# Patient Record
Sex: Female | Born: 1980 | Hispanic: No | Smoking: Never smoker
Health system: Southern US, Community
[De-identification: ages and names within clinical notes are randomized; demographics above are authoritative.]

---

## 2003-03-21 ENCOUNTER — Other Ambulatory Visit: Admission: RE | Admit: 2003-03-21 | Discharge: 2003-03-21 | Payer: Self-pay | Admitting: Family Medicine

## 2004-05-02 ENCOUNTER — Other Ambulatory Visit: Admission: RE | Admit: 2004-05-02 | Discharge: 2004-05-02 | Payer: Self-pay | Admitting: Family Medicine

## 2005-06-24 ENCOUNTER — Other Ambulatory Visit: Admission: RE | Admit: 2005-06-24 | Discharge: 2005-06-24 | Payer: Self-pay | Admitting: Family Medicine

## 2006-08-06 ENCOUNTER — Other Ambulatory Visit: Admission: RE | Admit: 2006-08-06 | Discharge: 2006-08-06 | Payer: Self-pay | Admitting: Family Medicine

## 2007-12-23 ENCOUNTER — Other Ambulatory Visit: Admission: RE | Admit: 2007-12-23 | Discharge: 2007-12-23 | Payer: Self-pay | Admitting: Family Medicine

## 2009-02-22 ENCOUNTER — Other Ambulatory Visit: Admission: RE | Admit: 2009-02-22 | Discharge: 2009-02-22 | Payer: Self-pay | Admitting: Family Medicine

## 2010-05-01 ENCOUNTER — Other Ambulatory Visit: Admission: RE | Admit: 2010-05-01 | Discharge: 2010-05-01 | Payer: Self-pay | Admitting: Family Medicine

## 2011-05-08 ENCOUNTER — Other Ambulatory Visit: Payer: Self-pay | Admitting: Family Medicine

## 2011-05-08 DIAGNOSIS — Z113 Encounter for screening for infections with a predominantly sexual mode of transmission: Secondary | ICD-10-CM | POA: Insufficient documentation

## 2011-05-08 DIAGNOSIS — Z124 Encounter for screening for malignant neoplasm of cervix: Secondary | ICD-10-CM | POA: Insufficient documentation

## 2011-05-15 ENCOUNTER — Other Ambulatory Visit (HOSPITAL_COMMUNITY)
Admission: RE | Admit: 2011-05-15 | Discharge: 2011-05-15 | Disposition: A | Discharge status: Home / Self Care | Payer: BC Managed Care – PPO | Source: Ambulatory Visit | Attending: Family Medicine | Admitting: Family Medicine

## 2012-05-17 ENCOUNTER — Other Ambulatory Visit (HOSPITAL_COMMUNITY)
Admission: RE | Admit: 2012-05-17 | Discharge: 2012-05-17 | Disposition: A | Discharge status: Home / Self Care | Payer: BC Managed Care – PPO | Source: Ambulatory Visit | Attending: Family Medicine | Admitting: Family Medicine

## 2012-05-17 ENCOUNTER — Other Ambulatory Visit: Payer: Self-pay | Admitting: Family Medicine

## 2012-05-17 DIAGNOSIS — Z124 Encounter for screening for malignant neoplasm of cervix: Secondary | ICD-10-CM | POA: Insufficient documentation

## 2012-05-17 DIAGNOSIS — Z113 Encounter for screening for infections with a predominantly sexual mode of transmission: Secondary | ICD-10-CM | POA: Insufficient documentation

## 2012-05-17 DIAGNOSIS — Z1151 Encounter for screening for human papillomavirus (HPV): Secondary | ICD-10-CM | POA: Insufficient documentation

## 2013-05-22 ENCOUNTER — Other Ambulatory Visit: Payer: Self-pay | Admitting: Family Medicine

## 2013-05-22 ENCOUNTER — Other Ambulatory Visit (HOSPITAL_COMMUNITY)
Admission: RE | Admit: 2013-05-22 | Discharge: 2013-05-22 | Disposition: A | Discharge status: Home / Self Care | Payer: BC Managed Care – PPO | Source: Ambulatory Visit | Attending: Family Medicine | Admitting: Family Medicine

## 2013-05-22 DIAGNOSIS — Z113 Encounter for screening for infections with a predominantly sexual mode of transmission: Secondary | ICD-10-CM | POA: Insufficient documentation

## 2013-05-22 DIAGNOSIS — Z1151 Encounter for screening for human papillomavirus (HPV): Secondary | ICD-10-CM | POA: Insufficient documentation

## 2013-05-22 DIAGNOSIS — Z0000 Encounter for general adult medical examination without abnormal findings: Secondary | ICD-10-CM | POA: Insufficient documentation

## 2014-05-23 ENCOUNTER — Other Ambulatory Visit: Payer: Self-pay | Admitting: Family Medicine

## 2014-05-23 ENCOUNTER — Other Ambulatory Visit (HOSPITAL_COMMUNITY)
Admission: RE | Admit: 2014-05-23 | Discharge: 2014-05-23 | Disposition: A | Discharge status: Home / Self Care | Payer: BC Managed Care – PPO | Source: Ambulatory Visit | Attending: Family Medicine | Admitting: Family Medicine

## 2014-05-23 DIAGNOSIS — Z113 Encounter for screening for infections with a predominantly sexual mode of transmission: Secondary | ICD-10-CM | POA: Diagnosis present

## 2014-05-23 DIAGNOSIS — R87810 Cervical high risk human papillomavirus (HPV) DNA test positive: Secondary | ICD-10-CM | POA: Diagnosis present

## 2014-05-23 DIAGNOSIS — Z1151 Encounter for screening for human papillomavirus (HPV): Secondary | ICD-10-CM | POA: Diagnosis present

## 2014-05-23 DIAGNOSIS — Z124 Encounter for screening for malignant neoplasm of cervix: Secondary | ICD-10-CM | POA: Diagnosis present

## 2014-05-24 LAB — CYTOLOGY - PAP

## 2014-06-14 ENCOUNTER — Other Ambulatory Visit: Payer: Self-pay | Admitting: Family Medicine

## 2015-05-28 ENCOUNTER — Other Ambulatory Visit (HOSPITAL_COMMUNITY)
Admission: RE | Admit: 2015-05-28 | Discharge: 2015-05-28 | Disposition: A | Discharge status: Home / Self Care | Payer: BLUE CROSS/BLUE SHIELD | Source: Ambulatory Visit | Attending: Family Medicine | Admitting: Family Medicine

## 2015-05-28 ENCOUNTER — Other Ambulatory Visit: Payer: Self-pay | Admitting: Family Medicine

## 2015-05-28 DIAGNOSIS — Z01411 Encounter for gynecological examination (general) (routine) with abnormal findings: Secondary | ICD-10-CM | POA: Insufficient documentation

## 2015-05-28 DIAGNOSIS — Z1151 Encounter for screening for human papillomavirus (HPV): Secondary | ICD-10-CM | POA: Insufficient documentation

## 2015-05-29 LAB — CYTOLOGY - PAP

## 2017-06-15 DIAGNOSIS — Z209 Contact with and (suspected) exposure to unspecified communicable disease: Secondary | ICD-10-CM | POA: Diagnosis not present

## 2017-06-15 DIAGNOSIS — E785 Hyperlipidemia, unspecified: Secondary | ICD-10-CM | POA: Diagnosis not present

## 2017-06-18 ENCOUNTER — Other Ambulatory Visit (HOSPITAL_COMMUNITY)
Admission: RE | Admit: 2017-06-18 | Discharge: 2017-06-18 | Disposition: A | Discharge status: Home / Self Care | Payer: BLUE CROSS/BLUE SHIELD | Source: Ambulatory Visit | Attending: Family Medicine | Admitting: Family Medicine

## 2017-06-18 ENCOUNTER — Other Ambulatory Visit: Payer: Self-pay | Admitting: Family Medicine

## 2017-06-18 DIAGNOSIS — E785 Hyperlipidemia, unspecified: Secondary | ICD-10-CM | POA: Diagnosis not present

## 2017-06-18 DIAGNOSIS — F419 Anxiety disorder, unspecified: Secondary | ICD-10-CM | POA: Diagnosis not present

## 2017-06-18 DIAGNOSIS — Z124 Encounter for screening for malignant neoplasm of cervix: Secondary | ICD-10-CM | POA: Insufficient documentation

## 2017-06-18 DIAGNOSIS — N898 Other specified noninflammatory disorders of vagina: Secondary | ICD-10-CM | POA: Diagnosis not present

## 2017-06-18 DIAGNOSIS — Z23 Encounter for immunization: Secondary | ICD-10-CM | POA: Diagnosis not present

## 2017-06-18 DIAGNOSIS — Z309 Encounter for contraceptive management, unspecified: Secondary | ICD-10-CM | POA: Diagnosis not present

## 2017-06-18 DIAGNOSIS — G479 Sleep disorder, unspecified: Secondary | ICD-10-CM | POA: Diagnosis not present

## 2017-06-18 DIAGNOSIS — Z0000 Encounter for general adult medical examination without abnormal findings: Secondary | ICD-10-CM | POA: Diagnosis not present

## 2017-06-25 LAB — CYTOLOGY - PAP
Chlamydia: NEGATIVE
DIAGNOSIS: NEGATIVE
NEISSERIA GONORRHEA: NEGATIVE

## 2017-11-11 ENCOUNTER — Ambulatory Visit: Payer: BLUE CROSS/BLUE SHIELD | Admitting: Podiatry

## 2017-11-11 ENCOUNTER — Encounter: Payer: Self-pay | Admitting: Podiatry

## 2017-11-11 VITALS — BP 126/76 | HR 84 | Ht 64.0 in | Wt 153.0 lb

## 2017-11-11 DIAGNOSIS — B351 Tinea unguium: Secondary | ICD-10-CM | POA: Diagnosis not present

## 2017-11-11 DIAGNOSIS — M6701 Short Achilles tendon (acquired), right ankle: Secondary | ICD-10-CM | POA: Diagnosis not present

## 2017-11-11 DIAGNOSIS — M722 Plantar fascial fibromatosis: Secondary | ICD-10-CM | POA: Diagnosis not present

## 2017-11-11 DIAGNOSIS — B353 Tinea pedis: Secondary | ICD-10-CM | POA: Diagnosis not present

## 2017-11-11 MED ORDER — TERBINAFINE HCL 250 MG PO TABS: 250.0000 mg | ORAL_TABLET | Freq: Every day | ORAL | 2 refills | Status: DC

## 2017-11-11 NOTE — Patient Instructions (Signed)
Seen for pain in right heel and fungal nails and itch skin. Noted of tight Achilles tendon on right. Stretch exercise reviewed to practice daily. Lamisil ordered.  Pre medication blood work ordered. Night Splint dispensed with instruction. Need to have Custom orthotics. Return in 2 weeks.

## 2017-11-11 NOTE — Progress Notes (Signed)
SUBJECTIVE: 37 y.o. year old female presents Pain in right heel x 2 weeks. Left side started to hurt. Does active exercise.  Positive history of digital surgery 15 years ago.  Review of Systems  Constitutional: Negative.   HENT: Negative.   Eyes: Negative.   Respiratory: Negative.   Cardiovascular: Negative.   Gastrointestinal: Negative.   Genitourinary: Negative.   Musculoskeletal: Positive for myalgias.  Skin: Negative.      OBJECTIVE: DERMATOLOGIC EXAMINATION: Thick yellow fungal nails with dry scaly skin plantar bilateral.  VASCULAR EXAMINATION OF LOWER LIMBS: All pedal pulses are palpable with normal pulsation.  Capillary Filling times within 3 seconds in all digits.  No edema or erythema noted. Temperature gradient from tibial crest to dorsum of foot is within normal bilateral.  NEUROLOGIC EXAMINATION OF THE LOWER LIMBS: All epicritic and tactile sensations grossly intact. Sharp and Dull discriminatory sensations at the plantar ball of hallux is intact bilateral.   MUSCULOSKELETAL EXAMINATION: Positive for tight Achilles tendon on right.  ASSESSMENT: Plantar fasciitis right. Tight Achilles tendon right. Tinea pedis bilateral. Onychomycosis x 10.  PLAN: Reviewed findings and available treatment options. Stretch exercise reviewed for tight Achilles tendon on right. May benefit from custom orthotics. Night Splint dispensed with instruction. Return in two weeks. Pre medication blood work prescribed for Lamisil 250 qd x 3 month.

## 2017-11-12 ENCOUNTER — Telehealth: Payer: Self-pay | Admitting: *Deleted

## 2017-11-12 NOTE — Telephone Encounter (Signed)
Benefits for custom molded orthotics are as follows: Deductible is $700. After the deductible is met patient is responsiible for 20% and insurance will cover 80%. Out of pocket max is 4,000 per benefit period . $35 copay per visit. Go to [email or web address] to view providers policy and guidelines to see if orthotics are medically neccessary.

## 2017-11-15 ENCOUNTER — Encounter: Payer: Self-pay | Admitting: *Deleted

## 2017-11-15 NOTE — Telephone Encounter (Signed)
Letter mailed to patient.

## 2017-11-15 NOTE — Telephone Encounter (Signed)
Called patient to notify of results. Patient states she would like to think about this before she decides. 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

## 2017-11-25 ENCOUNTER — Ambulatory Visit: Payer: BLUE CROSS/BLUE SHIELD | Admitting: Podiatry

## 2017-11-25 ENCOUNTER — Encounter: Payer: Self-pay | Admitting: Podiatry

## 2017-11-25 DIAGNOSIS — M6701 Short Achilles tendon (acquired), right ankle: Secondary | ICD-10-CM

## 2017-11-25 DIAGNOSIS — M722 Plantar fascial fibromatosis: Secondary | ICD-10-CM

## 2017-11-25 NOTE — Progress Notes (Signed)
SUBJECTIVE: 37 y.o. year old female presents to prepare for custom orthotics. Been doing stretch exercise as instructed. Patient is active in exercise. Been taking Lamisil for the last 2 weeks.  Positive history of digital surgery 15 years ago.  OBJECTIVE: DERMATOLOGIC EXAMINATION: Thick yellow fungal nails with dry scaly skin plantar bilateral.  VASCULAR EXAMINATION OF LOWER LIMBS: All pedal pulses are palpable with normal pulsation.  Capillary Filling times within 3 seconds in all digits.  No edema or erythema noted. Temperature gradient from tibial crest to dorsum of foot is within normal bilateral.  NEUROLOGIC EXAMINATION OF THE LOWER LIMBS: All epicritic and tactile sensations grossly intact. Sharp and Dull discriminatory sensations at the plantar ball of hallux is intact bilateral.   MUSCULOSKELETAL EXAMINATION: Positive for tight Achilles tendon on right.  ASSESSMENT: Plantar fasciitis right. Tight Achilles tendon right. Tinea pedis bilateral. Onychomycosis x 10.  PLAN: Both feet casted for Orthotics. Continue with stretch exercise as reviewed for tight Achilles tendon on right. Continue with Night Splint as instructed. Continue with Lamisil 250 qd. Will call when orthotics are ready.

## 2017-11-25 NOTE — Patient Instructions (Signed)
Both feet casted for Orthotics. Continue with stretch exercise as reviewed for tight Achilles tendon on right. Continue with Night Splint as instructed. Continue with Lamisil 250 qd. Will call when orthotics are ready.

## 2018-02-06 ENCOUNTER — Other Ambulatory Visit: Payer: Self-pay | Admitting: Podiatry

## 2018-02-09 DIAGNOSIS — L819 Disorder of pigmentation, unspecified: Secondary | ICD-10-CM | POA: Diagnosis not present

## 2018-07-11 DIAGNOSIS — R030 Elevated blood-pressure reading, without diagnosis of hypertension: Secondary | ICD-10-CM | POA: Diagnosis not present

## 2018-07-11 DIAGNOSIS — Z1329 Encounter for screening for other suspected endocrine disorder: Secondary | ICD-10-CM | POA: Diagnosis not present

## 2018-07-11 DIAGNOSIS — R079 Chest pain, unspecified: Secondary | ICD-10-CM | POA: Diagnosis not present

## 2018-07-11 DIAGNOSIS — R0789 Other chest pain: Secondary | ICD-10-CM | POA: Diagnosis not present

## 2018-07-20 DIAGNOSIS — R7309 Other abnormal glucose: Secondary | ICD-10-CM | POA: Diagnosis not present

## 2018-07-20 DIAGNOSIS — E785 Hyperlipidemia, unspecified: Secondary | ICD-10-CM | POA: Diagnosis not present

## 2018-07-22 DIAGNOSIS — R0789 Other chest pain: Secondary | ICD-10-CM | POA: Diagnosis not present

## 2018-07-22 DIAGNOSIS — Z309 Encounter for contraceptive management, unspecified: Secondary | ICD-10-CM | POA: Diagnosis not present

## 2018-07-22 DIAGNOSIS — F419 Anxiety disorder, unspecified: Secondary | ICD-10-CM | POA: Diagnosis not present

## 2018-07-22 DIAGNOSIS — Z0001 Encounter for general adult medical examination with abnormal findings: Secondary | ICD-10-CM | POA: Diagnosis not present

## 2018-07-22 DIAGNOSIS — Z23 Encounter for immunization: Secondary | ICD-10-CM | POA: Diagnosis not present

## 2018-07-22 DIAGNOSIS — E785 Hyperlipidemia, unspecified: Secondary | ICD-10-CM | POA: Diagnosis not present

## 2018-09-19 ENCOUNTER — Ambulatory Visit
Admission: RE | Admit: 2018-09-19 | Discharge: 2018-09-19 | Disposition: A | Discharge status: Home / Self Care | Payer: BLUE CROSS/BLUE SHIELD | Source: Ambulatory Visit | Attending: Physician Assistant | Admitting: Physician Assistant

## 2018-09-19 ENCOUNTER — Other Ambulatory Visit: Payer: Self-pay | Admitting: Physician Assistant

## 2018-09-19 DIAGNOSIS — K5900 Constipation, unspecified: Secondary | ICD-10-CM | POA: Diagnosis not present

## 2018-09-19 DIAGNOSIS — R1900 Intra-abdominal and pelvic swelling, mass and lump, unspecified site: Secondary | ICD-10-CM | POA: Diagnosis not present

## 2018-09-19 DIAGNOSIS — R140 Abdominal distension (gaseous): Secondary | ICD-10-CM | POA: Diagnosis not present

## 2018-09-23 DIAGNOSIS — E785 Hyperlipidemia, unspecified: Secondary | ICD-10-CM | POA: Diagnosis not present

## 2019-06-07 DIAGNOSIS — Z23 Encounter for immunization: Secondary | ICD-10-CM | POA: Diagnosis not present

## 2019-09-29 IMAGING — DX ABDOMEN - 2 VIEW
2 series · 2 of 2 positions shown · non-contrast
Comparison: None.

CLINICAL DATA: Abdominal bloating with history of constipation

EXAM:
ABDOMEN - 2 VIEW

[dg abd 2 views (1 of 2)]
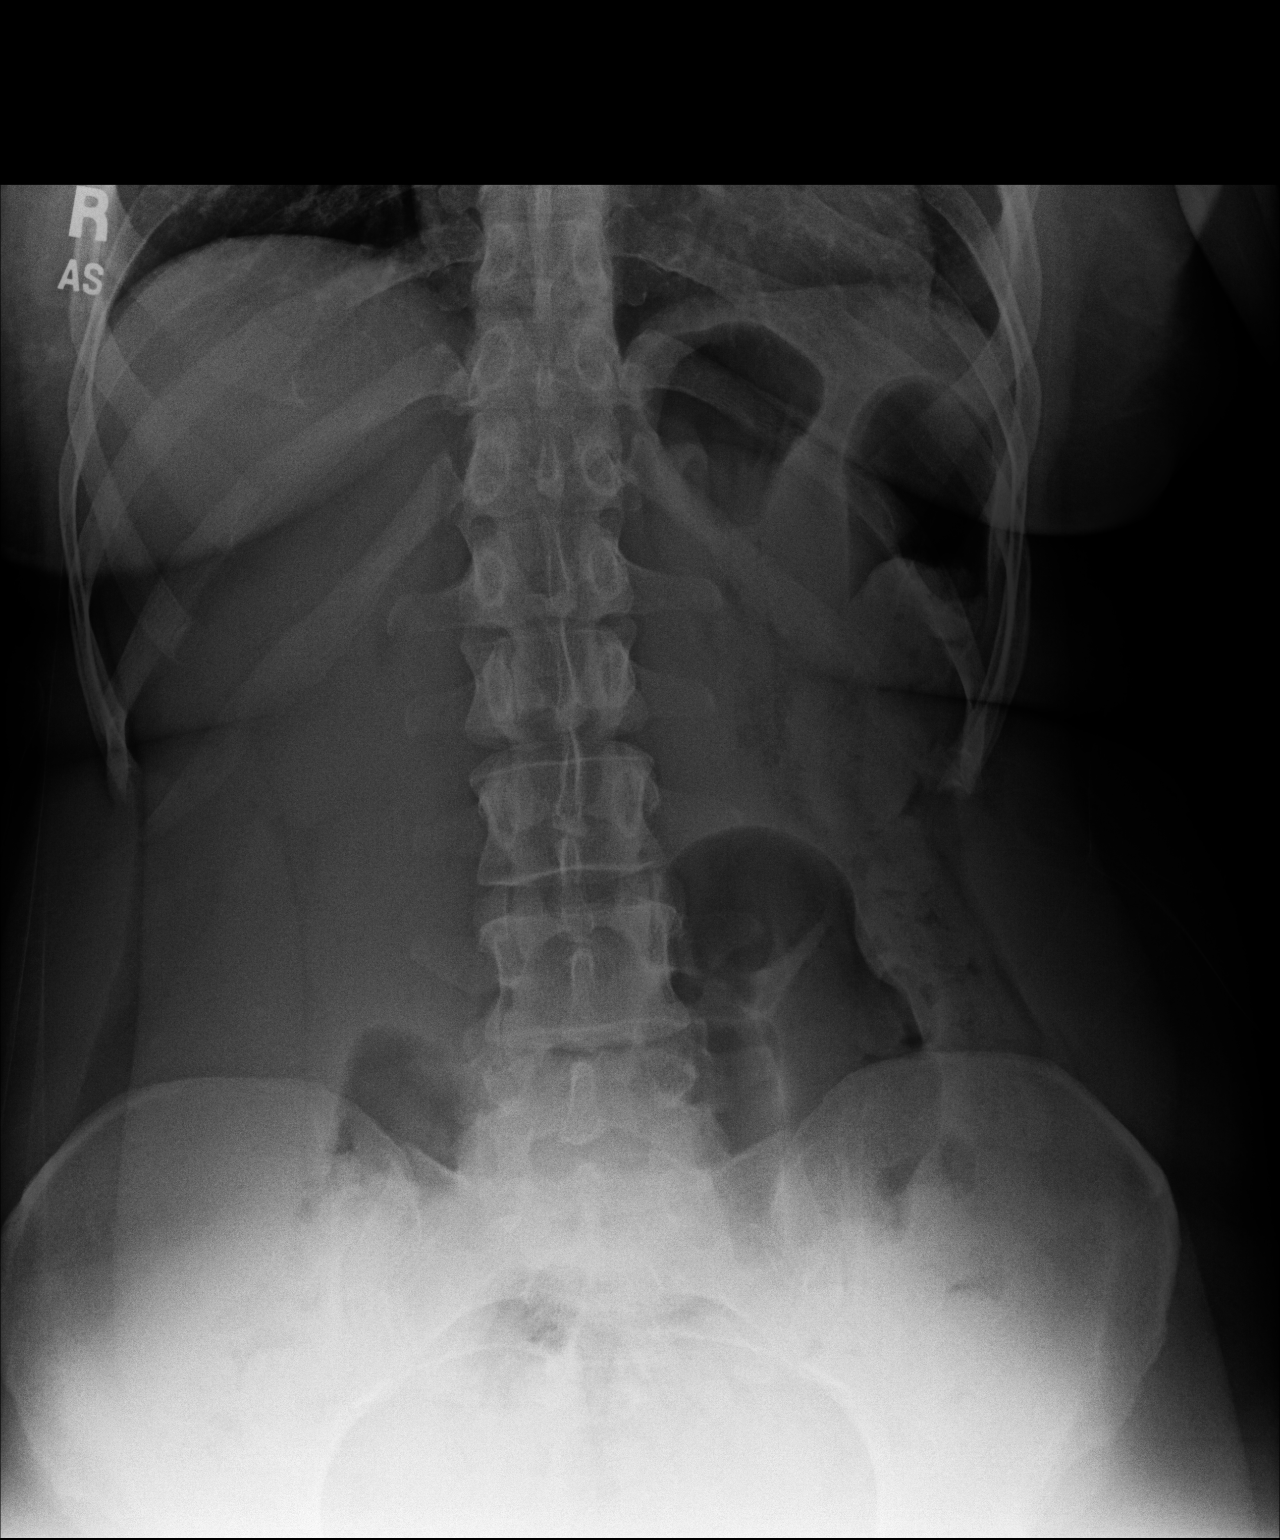

[dg abd 2 views (2 of 2)]
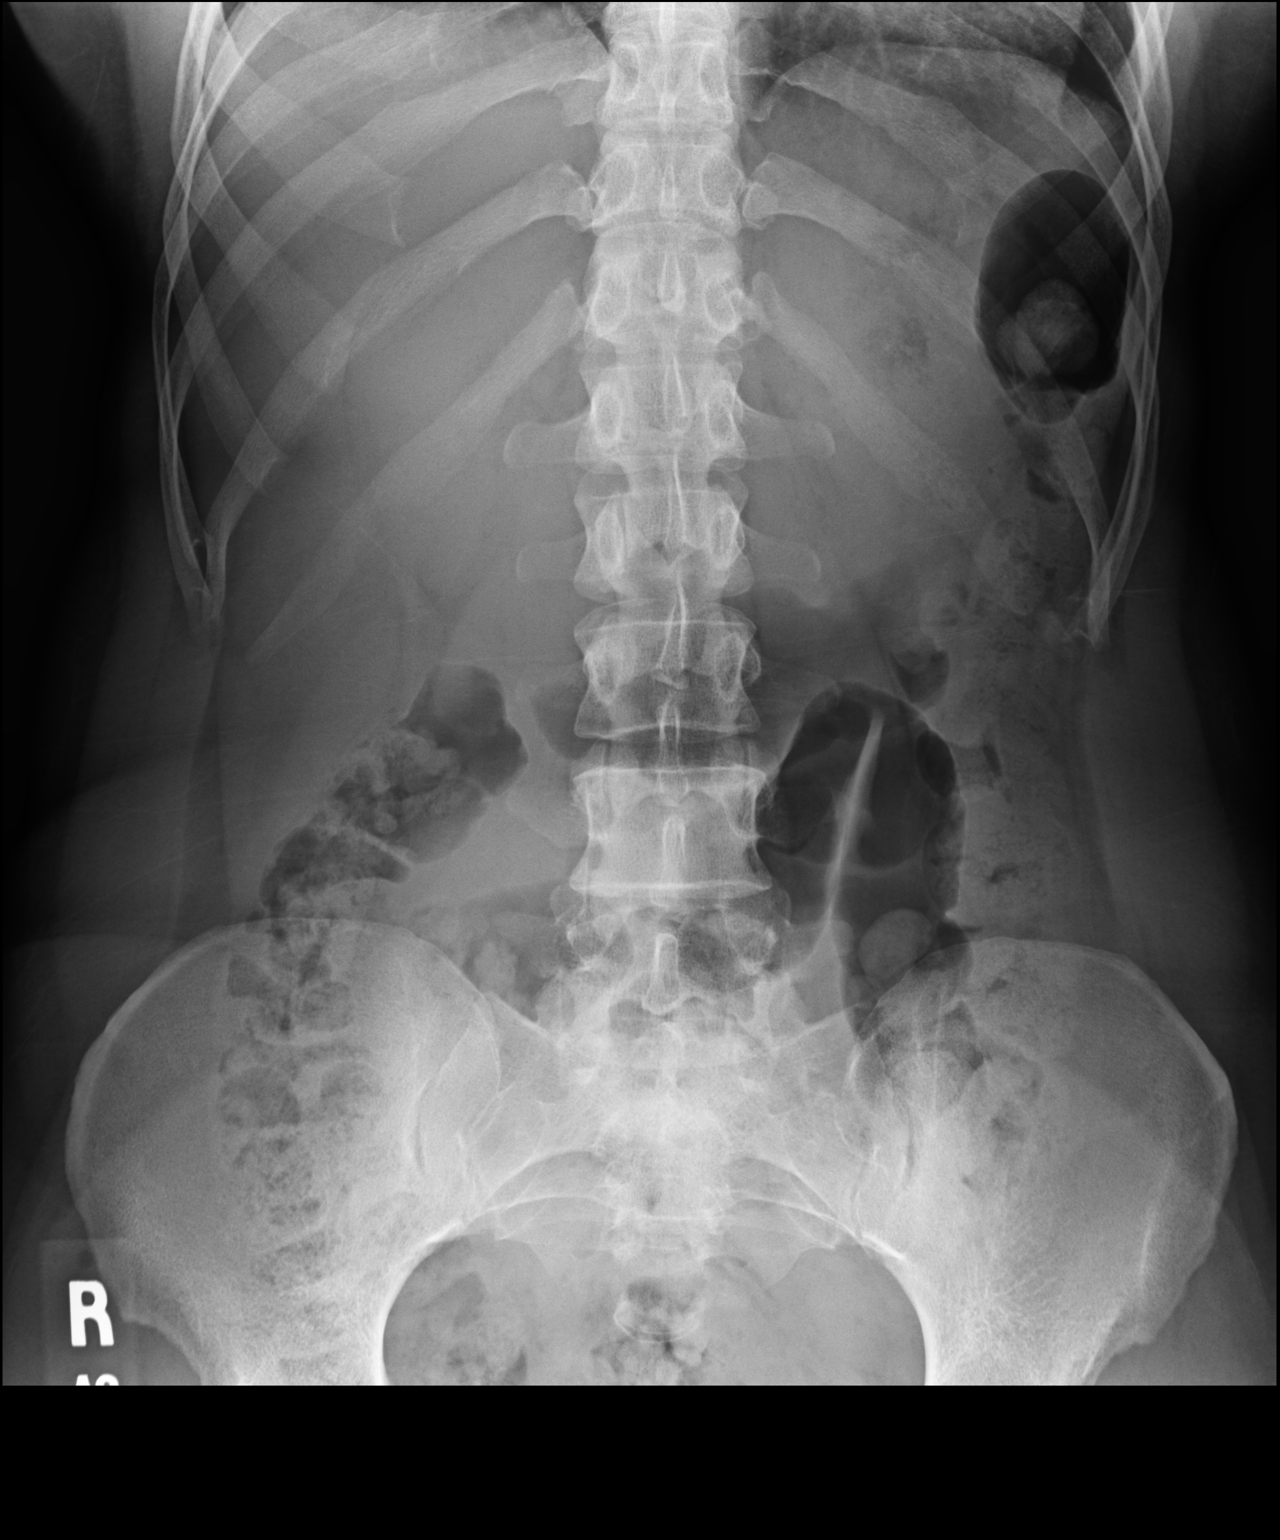

[2 of 2 positions shown; findings below may reference images not displayed]

FINDINGS: Supine and upright images were obtained. There is diffuse stool
throughout most of the colon. There is no appreciable bowel
dilatation or air-fluid level to suggest bowel obstruction. No free
air. No abnormal calcifications. Lung bases are clear.
IMPRESSION: Diffuse stool throughout most of colon, a finding indicative of
constipation. No bowel obstruction or free air evident. Lung bases
are clear.

## 2022-12-21 ENCOUNTER — Other Ambulatory Visit (HOSPITAL_COMMUNITY): Payer: Self-pay | Admitting: Family Medicine

## 2022-12-21 DIAGNOSIS — E785 Hyperlipidemia, unspecified: Secondary | ICD-10-CM

## 2022-12-21 DIAGNOSIS — R7303 Prediabetes: Secondary | ICD-10-CM | POA: Diagnosis not present

## 2022-12-21 DIAGNOSIS — Z3041 Encounter for surveillance of contraceptive pills: Secondary | ICD-10-CM | POA: Diagnosis not present

## 2022-12-21 DIAGNOSIS — Z0000 Encounter for general adult medical examination without abnormal findings: Secondary | ICD-10-CM | POA: Diagnosis not present

## 2022-12-21 DIAGNOSIS — Z6829 Body mass index (BMI) 29.0-29.9, adult: Secondary | ICD-10-CM | POA: Diagnosis not present

## 2022-12-21 DIAGNOSIS — F419 Anxiety disorder, unspecified: Secondary | ICD-10-CM | POA: Diagnosis not present

## 2022-12-21 DIAGNOSIS — J309 Allergic rhinitis, unspecified: Secondary | ICD-10-CM | POA: Diagnosis not present

## 2022-12-25 ENCOUNTER — Ambulatory Visit (HOSPITAL_COMMUNITY)
Admission: RE | Admit: 2022-12-25 | Discharge: 2022-12-25 | Disposition: A | Discharge status: Home / Self Care | Payer: BLUE CROSS/BLUE SHIELD | Source: Ambulatory Visit | Attending: Family Medicine | Admitting: Family Medicine

## 2022-12-25 DIAGNOSIS — E785 Hyperlipidemia, unspecified: Secondary | ICD-10-CM
